# Patient Record
Sex: Male | Born: 1980 | Race: White | Hispanic: No | Marital: Married | Smoking: Never smoker
Health system: Southern US, Community
[De-identification: ages and names within clinical notes are randomized; demographics above are authoritative.]

---

## 2014-06-05 ENCOUNTER — Encounter: Payer: Self-pay | Admitting: Family Medicine

## 2014-06-05 ENCOUNTER — Ambulatory Visit (INDEPENDENT_AMBULATORY_CARE_PROVIDER_SITE_OTHER): Payer: BLUE CROSS/BLUE SHIELD | Admitting: Family Medicine

## 2014-06-05 VITALS — BP 108/69 | HR 56 | Ht 74.0 in | Wt 253.0 lb

## 2014-06-05 DIAGNOSIS — Z0000 Encounter for general adult medical examination without abnormal findings: Secondary | ICD-10-CM | POA: Diagnosis not present

## 2014-06-05 DIAGNOSIS — E785 Hyperlipidemia, unspecified: Secondary | ICD-10-CM | POA: Insufficient documentation

## 2014-06-05 DIAGNOSIS — R740 Nonspecific elevation of levels of transaminase and lactic acid dehydrogenase [LDH]: Secondary | ICD-10-CM | POA: Diagnosis not present

## 2014-06-05 DIAGNOSIS — R7401 Elevation of levels of liver transaminase levels: Secondary | ICD-10-CM

## 2014-06-05 NOTE — Progress Notes (Signed)
Findings name CC: Donald Baker is a 34 y.o. male is here for Establish Care   Subjective: HPI:  Colonoscopy: No family history of colon cancer, will consider screening at age 34. Prostate: Discussed screening risks/beneifts with patient today, will begin screening at age 34.  Influenza Vaccine: Not indicated Pneumovax: Not indicated Td/Tdap: Up-to-date as of 5 years ago Zoster: (Start 32 yo)  Presents for complete physical exam.   His only complaint is that his ALT was elevated to 62 when checked 3 weeks ago. He works out regularly has lost 30 pounds since January does not drink any alcohol and does not use any acetaminophen products. He tells me in the past he was told he had a fatty liver but never had any imaging of his liver. Triglycerides were normal LDL was mildly elevated. He denies any recent or remote right upper quadrant pain nausea or abdominal pain.  Review of Systems - General ROS: negative for - chills, fever, night sweats, weight gain or weight loss Ophthalmic ROS: negative for - decreased vision Psychological ROS: negative for - anxiety or depression ENT ROS: negative for - hearing change, nasal congestion, tinnitus or allergies Hematological and Lymphatic ROS: negative for - bleeding problems, bruising or swollen lymph nodes Breast ROS: negative Respiratory ROS: no cough, shortness of breath, or wheezing Cardiovascular ROS: no chest pain or dyspnea on exertion Gastrointestinal ROS: no abdominal pain, change in bowel habits, or black or bloody stools Genito-Urinary ROS: negative for - genital discharge, genital ulcers, incontinence or abnormal bleeding from genitals Musculoskeletal ROS: negative for - joint pain or muscle pain Neurological ROS: negative for - headaches or memory loss Dermatological ROS: negative for lumps, mole changes, rash and skin lesion changes  History reviewed. No pertinent past medical history.  History reviewed. No pertinent past surgical  history. Family History  Problem Relation Age of Onset  . Leukemia      History   Social History  . Marital Status: Married    Spouse Name: N/A  . Number of Children: N/A  . Years of Education: N/A   Occupational History  . Not on file.   Social History Main Topics  . Smoking status: Never Smoker   . Smokeless tobacco: Not on file  . Alcohol Use: No  . Drug Use: No  . Sexual Activity:    Partners: Female   Other Topics Concern  . Not on file   Social History Narrative  . No narrative on file     Objective: BP 108/69 mmHg  Pulse 56  Ht 6\' 2"  (1.88 m)  Wt 253 lb (114.76 kg)  BMI 32.47 kg/m2  General: No Acute Distress HEENT: Atraumatic, normocephalic, conjunctivae normal without scleral icterus.  No nasal discharge, hearing grossly intact, TMs with good landmarks bilaterally with no middle ear abnormalities, posterior pharynx clear without oral lesions. Neck: Supple, trachea midline, no cervical nor supraclavicular adenopathy. Pulmonary: Clear to auscultation bilaterally without wheezing, rhonchi, nor rales. Cardiac: Regular rate and rhythm.  No murmurs, rubs, nor gallops. No peripheral edema.  2+ peripheral pulses bilaterally. Abdomen: Bowel sounds normal.  No masses.  Non-tender without rebound.  Negative Murphy's sign. MSK: Grossly intact, no signs of weakness.  Full strength throughout upper and lower extremities.  Full ROM in upper and lower extremities.  No midline spinal tenderness. Neuro: Gait unremarkable, CN II-XII grossly intact.  C5-C6 Reflex 2/4 Bilaterally, L4 Reflex 2/4 Bilaterally.  Cerebellar function intact. Skin: No rashes. Psych: Alert and oriented to person/place/time.  Thought  process normal. No anxiety/depression.  Assessment & Plan: Devanta was seen today for establish care.  Diagnoses and all orders for this visit:  Annual physical exam Orders: -     COMPLETE METABOLIC PANEL WITH GFR -     CBC  Elevated ALT measurement  Healthy  lifestyle interventions including but not limited to regular exercise, a healthy low fat diet, moderation of salt intake, the dangers of tobacco/alcohol/recreational drug use, nutrition supplementation, and accident avoidance were discussed with the patient and a handout was provided for future reference.  No need for lipid panel but discussed diet and exercise interventions to help lower LDL. Repeat liver enzymes, I've asked him to avoid doing any strenuous activity for 24 hours prior to this recheck.  Return if symptoms worsen or fail to improve.

## 2014-06-05 NOTE — Patient Instructions (Signed)
Dr. Kire Ferg's General Advice Following Your Complete Physical Exam  The Benefits of Regular Exercise: Unless you suffer from an uncontrolled cardiovascular condition, studies strongly suggest that regular exercise and physical activity will add to both the quality and length of your life.  The World Health Organization recommends 150 minutes of moderate intensity aerobic activity every week.  This is best split over 3-4 days a week, and can be as simple as a brisk walk for just over 35 minutes "most days of the week".  This type of exercise has been shown to lower LDL-Cholesterol, lower average blood sugars, lower blood pressure, lower cardiovascular disease risk, improve memory, and increase one's overall sense of wellbeing.  The addition of anaerobic (or "strength training") exercises offers additional benefits including but not limited to increased metabolism, prevention of osteoporosis, and improved overall cholesterol levels.  How Can I Strive For A Low-Fat Diet?: Current guidelines recommend that 25-35 percent of your daily energy (food) intake should come from fats.  One might ask how can this be achieved without having to dissect each meal on a daily basis?  Switch to skim or 1% milk instead of whole milk.  Focus on lean meats such as ground turkey, fresh fish, baked chicken, and lean cuts of beef as your source of dietary protein.  Limit saturated fat consumption to less than 10% of your daily caloric intake.  Limit trans fatty acid consumption primarily by limiting synthetic trans fats such as partially hydrogenated oils (Ex: fried fast foods).  Substitute olive or vegetable oil for solid fats where possible.  Moderation of Salt Intake: Provided you don't carry a diagnosis of congestive heart failure nor renal failure, I recommend a daily allowance of no more than 2300 mg of salt (sodium).  Keeping under this daily goal is associated with a decreased risk of cardiovascular events, creeping  above it can lead to elevated blood pressures and increases your risk of cardiovascular events.  Milligrams (mg) of salt is listed on all nutrition labels, and your daily intake can add up faster than you think.  Most canned and frozen dinners can pack in over half your daily salt allowance in one meal.    Lifestyle Health Risks: Certain lifestyle choices carry specific health risks.  As you may already know, tobacco use has been associated with increasing one's risk of cardiovascular disease, pulmonary disease, numerous cancers, among many other issues.  What you may not know is that there are medications and nicotine replacement strategies that can more than double your chances of successfully quitting.  I would be thrilled to help manage your quitting strategy if you currently use tobacco products.  When it comes to alcohol use, I've yet to find an "ideal" daily allowance.  Provided an individual does not have a medical condition that is exacerbated by alcohol consumption, general guidelines determine "safe drinking" as no more than two standard drinks for a man or no more than one standard drink for a male per day.  However, much debate still exists on whether any amount of alcohol consumption is technically "safe".  My general advice, keep alcohol consumption to a minimum for general health promotion.  If you or others believe that alcohol, tobacco, or recreational drug use is interfering with your life, I would be happy to provide confidential counseling regarding treatment options.  General "Over The Counter" Nutrition Advice: Postmenopausal women should aim for a daily calcium intake of 1200 mg, however a significant portion of this might already be   provided by diets including milk, yogurt, cheese, and other dairy products.  Vitamin D has been shown to help preserve bone density, prevent fatigue, and has even been shown to help reduce falls in the elderly.  Ensuring a daily intake of 800 Units of  Vitamin D is a good place to start to enjoy the above benefits, we can easily check your Vitamin D level to see if you'd potentially benefit from supplementation beyond 800 Units a day.  Folic Acid intake should be of particular concern to women of childbearing age.  Daily consumption of 400-800 mcg of Folic Acid is recommended to minimize the chance of spinal cord defects in a fetus should pregnancy occur.    For many adults, accidents still remain one of the most common culprits when it comes to cause of death.  Some of the simplest but most effective preventitive habits you can adopt include regular seatbelt use, proper helmet use, securing firearms, and regularly testing your smoke and carbon monoxide detectors.  Mesa Janus B. Darlen Gledhill DO [facility] [address], [address] [place], [place] [address] Phone: [phone]  

## 2014-06-07 LAB — CBC
HEMATOCRIT: 46.3 % (ref 39.0–52.0)
Hemoglobin: 15.4 g/dL (ref 13.0–17.0)
MCH: 30 pg (ref 26.0–34.0)
MCHC: 33.3 g/dL (ref 30.0–36.0)
MCV: 90.1 fL (ref 78.0–100.0)
MPV: 10.6 fL (ref 8.6–12.4)
Platelets: 171 10*3/uL (ref 150–400)
RBC: 5.14 MIL/uL (ref 4.22–5.81)
RDW: 12.9 % (ref 11.5–15.5)
WBC: 4 10*3/uL (ref 4.0–10.5)

## 2014-06-07 LAB — COMPLETE METABOLIC PANEL WITH GFR
ALT: 56 U/L — ABNORMAL HIGH (ref 0–53)
AST: 28 U/L (ref 0–37)
Albumin: 4.6 g/dL (ref 3.5–5.2)
Alkaline Phosphatase: 71 U/L (ref 39–117)
BILIRUBIN TOTAL: 0.9 mg/dL (ref 0.2–1.2)
BUN: 22 mg/dL (ref 6–23)
CO2: 24 meq/L (ref 19–32)
CREATININE: 1.16 mg/dL (ref 0.50–1.35)
Calcium: 9.5 mg/dL (ref 8.4–10.5)
Chloride: 101 mEq/L (ref 96–112)
GFR, EST NON AFRICAN AMERICAN: 83 mL/min
GLUCOSE: 96 mg/dL (ref 70–99)
Potassium: 4.1 mEq/L (ref 3.5–5.3)
Sodium: 138 mEq/L (ref 135–145)
Total Protein: 7 g/dL (ref 6.0–8.3)

## 2014-09-12 ENCOUNTER — Telehealth: Payer: Self-pay | Admitting: Family Medicine

## 2014-09-12 DIAGNOSIS — R748 Abnormal levels of other serum enzymes: Secondary | ICD-10-CM

## 2014-09-12 NOTE — Telephone Encounter (Signed)
Pt notified and labs for [facility] faxed to recheck liver enzymes

## 2014-09-12 NOTE — Telephone Encounter (Signed)
Pt called. He needs lab order to rechk abnormal.  Dr told him to have lab redone in 3 mnths.

## 2014-09-13 LAB — COMPLETE METABOLIC PANEL WITH GFR
ALK PHOS: 85 U/L (ref 40–115)
ALT: 100 U/L — ABNORMAL HIGH (ref 9–46)
AST: 39 U/L (ref 10–40)
Albumin: 4.2 g/dL (ref 3.6–5.1)
BUN: 20 mg/dL (ref 7–25)
CALCIUM: 9.1 mg/dL (ref 8.6–10.3)
CHLORIDE: 104 mmol/L (ref 98–110)
CO2: 25 mmol/L (ref 20–31)
Creat: 1.12 mg/dL (ref 0.60–1.35)
GFR, Est African American: >89 mL/min (ref >=60)
GFR, Est Non African American: 87 mL/min (ref >=60)
Glucose, Bld: 97 mg/dL (ref 65–99)
POTASSIUM: 4.2 mmol/L (ref 3.5–5.3)
SODIUM: 141 mmol/L (ref 135–146)
Total Bilirubin: 1 mg/dL (ref 0.2–1.2)
Total Protein: 6.6 g/dL (ref 6.1–8.1)

## 2014-09-14 ENCOUNTER — Telehealth: Payer: Self-pay | Admitting: Family Medicine

## 2014-09-14 MED ORDER — VITAMIN E 180 MG (400 UNIT) PO CAPS: 400.0000 [IU] | ORAL_CAPSULE | Freq: Two times a day (BID) | ORAL | Status: DC

## 2014-09-14 NOTE — Telephone Encounter (Signed)
Left message on vm with results and advice

## 2014-09-14 NOTE — Telephone Encounter (Signed)
Sue Lush, Will you please let patient know that his ALT liver enzyme remains elevated.  I'd recommend he start a 400 Unit Vitamin E supplement twice a day and return to see me in one month.

## 2014-10-13 ENCOUNTER — Encounter: Payer: Self-pay | Admitting: Family Medicine

## 2014-10-13 ENCOUNTER — Ambulatory Visit (INDEPENDENT_AMBULATORY_CARE_PROVIDER_SITE_OTHER): Payer: BLUE CROSS/BLUE SHIELD | Admitting: Family Medicine

## 2014-10-13 VITALS — BP 116/74 | HR 65 | Wt 236.0 lb

## 2014-10-13 DIAGNOSIS — R7989 Other specified abnormal findings of blood chemistry: Secondary | ICD-10-CM | POA: Diagnosis not present

## 2014-10-13 DIAGNOSIS — K5900 Constipation, unspecified: Secondary | ICD-10-CM | POA: Insufficient documentation

## 2014-10-13 DIAGNOSIS — Z23 Encounter for immunization: Secondary | ICD-10-CM | POA: Diagnosis not present

## 2014-10-13 DIAGNOSIS — R945 Abnormal results of liver function studies: Secondary | ICD-10-CM

## 2014-10-13 LAB — HEPATIC FUNCTION PANEL
ALT: 45 U/L (ref 9–46)
AST: 21 U/L (ref 10–40)
Albumin: 4.2 g/dL (ref 3.6–5.1)
Alkaline Phosphatase: 66 U/L (ref 40–115)
BILIRUBIN TOTAL: 1.1 mg/dL (ref 0.2–1.2)
Bilirubin, Direct: 0.2 mg/dL (ref <=0.2)
Indirect Bilirubin: 0.9 mg/dL (ref 0.2–1.2)
TOTAL PROTEIN: 6.4 g/dL (ref 6.1–8.1)

## 2014-10-13 NOTE — Progress Notes (Signed)
CC: Donald Baker is a 34 y.o. male is here for bowel issues   Subjective: HPI:  Follow-up elevated LFTs: He's been taking 400 units of vitamin D twice a day for the last month. He's noticed some right upper quadrant pain that developed over the last month described as a stabbing sensation that will occur only for seconds at a time that is mild in severity and nonradiating. It occurs a couple times a week. It is not reproducible. He's never had this before. He had his wife deny any yellowing of the skin or conjunctiva. He denies any nausea, vomiting or decrease in appetite. He's lost weight since I saw him last intentionally by reducing calories.  Complains of pain with defecation and often requiring 20 minutes on the toilet to feel like he is evacuated his bowels. This complaint of pain is localized to the rectum. He states that the caliber of his stool is about the size of the finger. He tells me he only has a bowel movement 3 times a week. He denies any pain other than that described above. No interventions as of yet. Symptoms at present on a monthly basis for the past year but he's never had this before. Denies any melanoma or blood in his stool.    Review Of Systems Outlined In HPI  No past medical history on file.  No past surgical history on file. Family History  Problem Relation Age of Onset  . Leukemia      Social History   Social History  . Marital Status: Married    Spouse Name: N/A  . Number of Children: N/A  . Years of Education: N/A   Occupational History  . Not on file.   Social History Main Topics  . Smoking status: Never Smoker   . Smokeless tobacco: Not on file  . Alcohol Use: No  . Drug Use: No  . Sexual Activity:    Partners: Female   Other Topics Concern  . Not on file   Social History Narrative     Objective: BP 116/74 mmHg  Pulse 65  Wt 236 lb (107.049 kg)  General: Alert and Oriented, No Acute Distress HEENT: Pupils equal, round, reactive to  light. Conjunctivae clear.Moist nevus membranes  Lungs:Clear and comfortable work of breathing  Cardiac: Regular rate and rhythm. Normal S1/S2.  No murmurs, rubs, nor gallops.   Abdomen: Normal bowel sounds, soft and non tender without palpable masses. No guarding rigidity or rebound tenderness  Extremities: No peripheral edema.  Strong peripheral pulses.  Mental Status: No depression, anxiety, nor agitation. Skin: Warm and dry.  Assessment & Plan: Hailey was seen today for bowel issues.  Diagnoses and all orders for this visit:  Constipation, unspecified constipation type -     Cancel: US Abdomen Complete; Future  Elevated LFTs -     Cancel: US Abdomen Complete; Future -     Hepatic function panel -     US Abdomen Complete  Other orders -     Flu Vaccine QUAD 36+ mos IM   Constipation: Given decrease in caliber he was provided with stool cards today to screen for any pathology that would be bleeding in the colon such as a mass or inflammatory bowel disease Given the right upper quadrant pain obtaining an ultrasound of the abdomen to rule out cholelithiasis or hepatitis. Repeating liver function tests today as previously planned.  Ultimate plan will be based on the above results   Return if symptoms worsen or  fail to improve.

## 2014-10-17 ENCOUNTER — Telehealth: Payer: Self-pay | Admitting: Family Medicine

## 2014-10-17 ENCOUNTER — Ambulatory Visit (INDEPENDENT_AMBULATORY_CARE_PROVIDER_SITE_OTHER): Payer: BLUE CROSS/BLUE SHIELD

## 2014-10-17 DIAGNOSIS — R1011 Right upper quadrant pain: Secondary | ICD-10-CM | POA: Diagnosis not present

## 2014-10-17 MED ORDER — LINACLOTIDE 145 MCG PO CAPS: 145.0000 ug | ORAL_CAPSULE | Freq: Every day | ORAL | Status: DC

## 2014-10-17 NOTE — Telephone Encounter (Signed)
Pt called regarding [place] results, will route to PCP for review.

## 2014-10-17 NOTE — Telephone Encounter (Signed)
Sue Lush, Will you please let patient know that his liver function tests are all within normal limits.  I'd recommend continuing on Vitamin E and starting a constipation medication called Linzess.  A Rx has been sent to [facility].  Please f/u in one month.

## 2014-10-17 NOTE — Telephone Encounter (Signed)
Pt.notified

## 2014-10-18 LAB — POC HEMOCCULT BLD/STL (HOME/3-CARD/SCREEN)
Card #2 Fecal Occult Blod, POC: NEGATIVE
FECAL OCCULT BLD: NEGATIVE
FECAL OCCULT BLD: NEGATIVE

## 2014-10-18 NOTE — Telephone Encounter (Signed)
Sue Lush, Will you please let patient know that his ultrasound results were not sent to me until after we closed yesterday.  Fortunately there was no abnormality seen on his study.  This is reassuring and I'd still recommend starting the Linzess Rxed yesterday.

## 2014-10-18 NOTE — Telephone Encounter (Signed)
Pt states he does not have rx insurance. He wanted to know what else could be reccomended otc. I advised he could use miralax

## 2014-10-18 NOTE — Addendum Note (Signed)
Addended by: Wyline Beady on: 10/18/2014 04:51 PM   Modules accepted: Orders

## 2018-05-10 ENCOUNTER — Other Ambulatory Visit: Payer: Self-pay

## 2018-05-10 ENCOUNTER — Ambulatory Visit (INDEPENDENT_AMBULATORY_CARE_PROVIDER_SITE_OTHER): Payer: 59 | Admitting: Sports Medicine

## 2018-05-10 ENCOUNTER — Ambulatory Visit (INDEPENDENT_AMBULATORY_CARE_PROVIDER_SITE_OTHER): Payer: 59

## 2018-05-10 DIAGNOSIS — M4726 Other spondylosis with radiculopathy, lumbar region: Secondary | ICD-10-CM

## 2018-05-10 DIAGNOSIS — M5416 Radiculopathy, lumbar region: Secondary | ICD-10-CM

## 2018-05-10 MED ORDER — GABAPENTIN 300 MG PO CAPS: ORAL_CAPSULE | ORAL | 3 refills | Status: DC

## 2018-05-10 NOTE — Assessment & Plan Note (Addendum)
Right L5 radiculitis, failed 2 months now with conservative measures including chiropractic manipulation, steroids. X-rays, MRI ASAP. More aggressive formal PT. Adding gabapentin. Return to see me in 6 weeks, I will set him up for an epidural if no better.

## 2018-05-10 NOTE — Progress Notes (Signed)
Subjective:    CC: Back pain  HPI:  For a couple of months this pleasant 38 year old male has had pain in his low back with radiation down to the buttock, down to the bottom of the right foot.  Worse with standing up straight, pain is worsening, persistent.  No bowel or bladder dysfunction, saddle numbness, constitutional symptoms.  He has had 2 months of conservative measures including chiropractic manipulation, he has had a burst of prednisone, nothing seems to really be helping.  I reviewed the past medical history, family history, social history, surgical history, and allergies today and no changes were needed.  Please see the problem list section below in epic for further details.  Past Medical History: No past medical history on file. Past Surgical History: No past surgical history on file. Social History: Social History   Socioeconomic History  . Marital status: Married    Spouse name: Not on file  . Number of children: Not on file  . Years of education: Not on file  . Highest education level: Not on file  Occupational History  . Not on file  Social Needs  . Financial resource strain: Not on file  . Food insecurity:    Worry: Not on file    Inability: Not on file  . Transportation needs:    Medical: Not on file    Non-medical: Not on file  Tobacco Use  . Smoking status: Never Smoker  Substance and Sexual Activity  . Alcohol use: No    Alcohol/week: 0.0 standard drinks  . Drug use: No  . Sexual activity: Yes    Partners: Female  Lifestyle  . Physical activity:    Days per week: Not on file    Minutes per session: Not on file  . Stress: Not on file  Relationships  . Social connections:    Talks on phone: Not on file    Gets together: Not on file    Attends religious service: Not on file    Active member of club or organization: Not on file    Attends meetings of clubs or organizations: Not on file    Relationship status: Not on file  Other Topics Concern   . Not on file  Social History Narrative  . Not on file   Family History: Family History  Problem Relation Age of Onset  . Leukemia Unknown    Allergies: No Known Allergies Medications: See med rec.  Review of Systems: No headache, visual changes, nausea, vomiting, diarrhea, constipation, dizziness, abdominal pain, skin rash, fevers, chills, night sweats, swollen lymph nodes, weight loss, chest pain, body aches, joint swelling, muscle aches, shortness of breath, mood changes, visual or auditory hallucinations.  Objective:    General: Well Developed, well nourished, and in no acute distress.  Neuro: Alert and oriented x3, extra-ocular muscles intact, sensation grossly intact.  HEENT: Normocephalic, atraumatic, pupils equal round reactive to light, neck supple, no masses, no lymphadenopathy, thyroid nonpalpable.  Skin: Warm and dry, no rashes noted.  Cardiac: Regular rate and rhythm, no murmurs rubs or gallops.  Respiratory: Clear to auscultation bilaterally. Not using accessory muscles, speaking in full sentences.  Abdominal: Soft, nontender, nondistended, positive bowel sounds, no masses, no organomegaly.  Back Exam:  Inspection: Unremarkable  Motion: Flexion 45 deg, Extension 45 deg, Side Bending to 45 deg bilaterally,  Rotation to 45 deg bilaterally  SLR laying: Positive on the right XSLR laying: Negative  Palpable tenderness: None. FABER: negative. Sensory change: Gross sensation intact to  all lumbar and sacral dermatomes.  Reflexes: 2+ at both patellar tendons, 2+ at achilles tendons, Babinski's downgoing.  Strength at foot  Plantar-flexion: 5/5 Dorsi-flexion: 5/5 Eversion: 5/5 Inversion: 5/5  Leg strength  Quad: 5/5 Hamstring: 5/5 Hip flexor: 5/5 Hip abductors: 5/5  Gait unremarkable.  Impression and Recommendations:    The patient was counselled, risk factors were discussed, anticipatory guidance given.  Right lumbar radiculitis Right L5 radiculitis, failed 2 months  now with conservative measures including chiropractic manipulation, steroids. X-rays, MRI ASAP. More aggressive formal PT. Adding gabapentin. Return to see me in 6 weeks, I will set him up for an epidural if no better.   ___________________________________________ Ihor Austin. Benjamin Stain, M.D., ABFM., CAQSM. Primary Care and Sports Medicine [facility]  Adjunct Professor of Family Medicine  [employer]

## 2018-05-16 ENCOUNTER — Other Ambulatory Visit: Payer: Self-pay

## 2018-05-16 ENCOUNTER — Ambulatory Visit (INDEPENDENT_AMBULATORY_CARE_PROVIDER_SITE_OTHER): Payer: 59

## 2018-05-16 DIAGNOSIS — M5116 Intervertebral disc disorders with radiculopathy, lumbar region: Secondary | ICD-10-CM

## 2018-05-16 DIAGNOSIS — M5416 Radiculopathy, lumbar region: Secondary | ICD-10-CM

## 2018-05-28 ENCOUNTER — Telehealth: Payer: Self-pay | Admitting: *Deleted

## 2018-05-28 DIAGNOSIS — M5416 Radiculopathy, lumbar region: Secondary | ICD-10-CM

## 2018-05-28 MED ORDER — MELOXICAM 15 MG PO TABS: ORAL_TABLET | ORAL | 3 refills | Status: AC

## 2018-05-28 NOTE — Telephone Encounter (Signed)
Switching to meloxicam, no ibuprofen when he takes meloxicam. He can also do an arthritis strength Tylenol 3 times daily.

## 2018-05-28 NOTE — Telephone Encounter (Signed)
Pt notified of medication instructions. 

## 2018-05-28 NOTE — Telephone Encounter (Signed)
Pt called today wanting to know if Meloxicam would be more beneficial to him than the 800mg  of ibuprofen that he's taking around every 6hrs every day.  He said the last couple weeks of PT have been pretty rough and that he's had quite a bit of pain but they did dry needling today and he actually feels a little bit better now.  Please advise on the anti-inflammatories.

## 2018-06-01 ENCOUNTER — Other Ambulatory Visit: Payer: Self-pay | Admitting: Sports Medicine

## 2018-06-01 DIAGNOSIS — M5416 Radiculopathy, lumbar region: Secondary | ICD-10-CM

## 2018-06-02 ENCOUNTER — Other Ambulatory Visit: Payer: Self-pay | Admitting: Sports Medicine

## 2018-06-02 DIAGNOSIS — M5416 Radiculopathy, lumbar region: Secondary | ICD-10-CM

## 2018-06-02 MED ORDER — GABAPENTIN 300 MG PO CAPS: ORAL_CAPSULE | ORAL | 2 refills | Status: DC

## 2018-06-18 ENCOUNTER — Ambulatory Visit (INDEPENDENT_AMBULATORY_CARE_PROVIDER_SITE_OTHER): Payer: 59 | Admitting: Sports Medicine

## 2018-06-18 ENCOUNTER — Encounter: Payer: Self-pay | Admitting: Sports Medicine

## 2018-06-18 DIAGNOSIS — M5416 Radiculopathy, lumbar region: Secondary | ICD-10-CM

## 2018-06-18 MED ORDER — PREDNISONE 10 MG (48) PO TBPK: ORAL_TABLET | Freq: Every day | ORAL | 0 refills | Status: DC

## 2018-06-18 NOTE — Assessment & Plan Note (Signed)
Right L5 radiculitis, improved with physical therapy, gabapentin at 1500 mg in the evening, avoiding daytime dosing due to drowsiness. MRI shows an L4-L5 disc protrusion affecting the right L5 nerve root. I agree that we can continue physical therapy and gabapentin as well as a prolonged prednisone taper for at least another month before considering intervention. If insufficient improvement by the next visit we will proceed with a right L5-S1 transforaminal epidural.

## 2018-06-18 NOTE — Progress Notes (Signed)
Subjective:    CC: Follow-up  HPI: This is a pleasant 38 year old male, we have been treating him for a right L5 radiculitis, he has improved with physical therapy, steroids, gabapentin at 1500 mg at night, daytime dosing causes excessive grogginess.  Happy with how things are going and desires to do additional conservative treatment.  I reviewed the past medical history, family history, social history, surgical history, and allergies today and no changes were needed.  Please see the problem list section below in epic for further details.  Past Medical History: No past medical history on file. Past Surgical History: No past surgical history on file. Social History: Social History   Socioeconomic History  . Marital status: Married    Spouse name: Not on file  . Number of children: Not on file  . Years of education: Not on file  . Highest education level: Not on file  Occupational History  . Not on file  Social Needs  . Financial resource strain: Not on file  . Food insecurity:    Worry: Not on file    Inability: Not on file  . Transportation needs:    Medical: Not on file    Non-medical: Not on file  Tobacco Use  . Smoking status: Never Smoker  . Smokeless tobacco: Never Used  Substance and Sexual Activity  . Alcohol use: No    Alcohol/week: 0.0 standard drinks  . Drug use: No  . Sexual activity: Yes    Partners: Female  Lifestyle  . Physical activity:    Days per week: Not on file    Minutes per session: Not on file  . Stress: Not on file  Relationships  . Social connections:    Talks on phone: Not on file    Gets together: Not on file    Attends religious service: Not on file    Active member of club or organization: Not on file    Attends meetings of clubs or organizations: Not on file    Relationship status: Not on file  Other Topics Concern  . Not on file  Social History Narrative  . Not on file   Family History: Family History  Problem Relation Age  of Onset  . Leukemia Unknown    Allergies: No Known Allergies Medications: See med rec.  Review of Systems: No fevers, chills, night sweats, weight loss, chest pain, or shortness of breath.   Objective:    General: Well Developed, well nourished, and in no acute distress.  Neuro: Alert and oriented x3, extra-ocular muscles intact, sensation grossly intact.  HEENT: Normocephalic, atraumatic, pupils equal round reactive to light, neck supple, no masses, no lymphadenopathy, thyroid nonpalpable.  Skin: Warm and dry, no rashes. Cardiac: Regular rate and rhythm, no murmurs rubs or gallops, no lower extremity edema.  Respiratory: Clear to auscultation bilaterally. Not using accessory muscles, speaking in full sentences.  Impression and Recommendations:    Right lumbar radiculitis Right L5 radiculitis, improved with physical therapy, gabapentin at 1500 mg in the evening, avoiding daytime dosing due to drowsiness. MRI shows an L4-L5 disc protrusion affecting the right L5 nerve root. I agree that we can continue physical therapy and gabapentin as well as a prolonged prednisone taper for at least another month before considering intervention. If insufficient improvement by the next visit we will proceed with a right L5-S1 transforaminal epidural.   ___________________________________________ Ihor Austin. Benjamin Stain, M.D., ABFM., CAQSM. Primary Care and Sports Medicine [facility]  Adjunct Professor of [facility] [employer] [facility]  Medicine  [employer]

## 2018-06-22 ENCOUNTER — Ambulatory Visit: Payer: 59 | Admitting: Sports Medicine

## 2018-07-16 ENCOUNTER — Ambulatory Visit: Payer: 59 | Admitting: Sports Medicine

## 2018-09-06 ENCOUNTER — Telehealth: Payer: Self-pay | Admitting: *Deleted

## 2018-09-06 DIAGNOSIS — M5416 Radiculopathy, lumbar region: Secondary | ICD-10-CM

## 2018-09-06 NOTE — Telephone Encounter (Signed)
Pt left vm stating that he is ready to proceed with an epidural.

## 2018-09-06 NOTE — Telephone Encounter (Signed)
Epidural ordered, please contact [facility] for scheduling. 

## 2018-09-07 NOTE — Telephone Encounter (Signed)
Order given to [facility] and pt was notified of the order as well.

## 2018-09-15 ENCOUNTER — Ambulatory Visit
Admission: RE | Admit: 2018-09-15 | Discharge: 2018-09-15 | Disposition: A | Discharge status: Home / Self Care | Payer: No Typology Code available for payment source | Source: Ambulatory Visit | Attending: Sports Medicine | Admitting: Sports Medicine

## 2018-09-15 ENCOUNTER — Other Ambulatory Visit: Payer: Self-pay

## 2018-09-15 MED ORDER — METHYLPREDNISOLONE ACETATE 40 MG/ML INJ SUSP (RADIOLOG
120.0000 mg | Freq: Once | INTRAMUSCULAR | Status: AC
  Administered 2018-09-15: 120 mg via EPIDURAL

## 2018-09-15 MED ORDER — IOPAMIDOL (ISOVUE-M 200) INJECTION 41%
1.0000 mL | Freq: Once | INTRAMUSCULAR | Status: AC
  Administered 2018-09-15: 09:00:00 1 mL via EPIDURAL

## 2018-09-15 NOTE — Discharge Instructions (Signed)

## 2018-10-13 ENCOUNTER — Emergency Department
Admission: EM | Admit: 2018-10-13 | Discharge: 2018-10-13 | Disposition: A | Discharge status: Home / Self Care | Payer: No Typology Code available for payment source | Source: Home / Self Care | Attending: Emergency Medicine | Admitting: Emergency Medicine

## 2018-10-13 ENCOUNTER — Other Ambulatory Visit: Payer: Self-pay

## 2018-10-13 DIAGNOSIS — Z23 Encounter for immunization: Secondary | ICD-10-CM

## 2018-10-13 DIAGNOSIS — S71132A Puncture wound without foreign body, left thigh, initial encounter: Secondary | ICD-10-CM

## 2018-10-13 MED ORDER — AMOXICILLIN-POT CLAVULANATE 875-125 MG PO TABS: 1.0000 | ORAL_TABLET | Freq: Two times a day (BID) | ORAL | 0 refills | Status: AC

## 2018-10-13 MED ORDER — TETANUS-DIPHTH-ACELL PERTUSSIS 5-2.5-18.5 LF-MCG/0.5 IM SUSP
0.5000 mL | Freq: Once | INTRAMUSCULAR | Status: AC
  Administered 2018-10-13: 0.5 mL via INTRAMUSCULAR

## 2018-10-13 NOTE — Discharge Instructions (Signed)
You have a puncture wound left thigh, occurred 7 days ago. No bleeding now.  No sign of foreign body. Today, we cleaned and wrapped the wound.  I would not use Steri-Strips at this point, as we need to allow the wound to heal from underneath. Gently clean the wound with antibacterial soap, and redress with a bandage once a day. You likely have low-grade wound infection.-Have sent an antibiotic, Augmentin to your pharmacy, twice a day for 7 days. Tetanus shot given today. Follow-up here or with PCP if not improved in 1 week, sooner if worse or new symptoms. Please read attached instruction sheet on puncture wound.

## 2018-10-13 NOTE — ED Provider Notes (Signed)
[facility]    CSN: [id number] Arrival date & time: 10/13/18  1230      History   Chief Complaint Chief Complaint  Patient presents with  . Laceration    HPI Donald Baker is a 38 y.o. male.   HPI Chief complaint: Puncture wound left thigh. Occurred 7 days ago when he was at the beach, he was cleaning fish and dropped a fillet knife accidentally, incurring puncture wound left anterior thigh.  Initially bled, with bleeding controlled with direct pressure and he has been cleaning it and dressing daily.  Yesterday it bled a little bit, he applied Steri-Strips, but has not had any bleeding in the past 24 hours.  Denies pain in left thigh.  Denies numbness or weakness or loss of function.  Denies red streaks. No fever or chills or nausea or vomiting.  Last tetanus shot 2011. History reviewed. No pertinent past medical history.  Patient Active Problem List   Diagnosis Date Noted  . Right lumbar radiculitis 05/10/2018  . Hyperlipidemia 06/05/2014    History reviewed. No pertinent surgical history.     Home Medications    Prior to Admission medications   Medication Sig Start Date End Date Taking? Authorizing Provider  amoxicillin-clavulanate (AUGMENTIN) 875-125 MG tablet Take 1 tablet by mouth 2 (two) times daily for 7 days. Take for 10 days.  Take with food 10/13/18 10/20/18  Jacqulyn Cane, MD  CLOMIPHENE CITRATE PO Take by mouth.    [provider]  gabapentin (NEURONTIN) 300 MG capsule 1 capsule p.o. up to 3 times a day 06/02/18   Silverio Decamp, MD  meloxicam (MOBIC) 15 MG tablet One tab PO qAM with breakfast for 2 weeks, then daily prn pain. 05/28/18   Silverio Decamp, MD  testosterone cypionate (DEPOTESTOSTERONE CYPIONATE) 200 MG/ML injection ADMINISTER 0.5 ML (100 MG) INTRAMUSCULARLY EVERY WEEK 05/06/18   [provider]    Family History Family History  Problem Relation Age of Onset  . Leukemia Other     Social History Social  History   Tobacco Use  . Smoking status: Never Smoker  . Smokeless tobacco: Never Used  Substance Use Topics  . Alcohol use: No    Alcohol/week: 0.0 standard drinks  . Drug use: No     Allergies   Patient has no known allergies.   Review of Systems Review of Systems  All other systems reviewed and are negative.    Physical Exam Triage Vital Signs ED Triage Vitals  Enc Vitals Group     BP 10/13/18 1251 (!) 148/91     Pulse Rate 10/13/18 1251 82     Resp 10/13/18 1251 18     Temp 10/13/18 1251 98.7 F (37.1 C)     Temp Source 10/13/18 1251 Oral     SpO2 10/13/18 1251 97 %     Weight 10/13/18 1252 286 lb 9.6 oz (130 kg)     Height 10/13/18 1252 6\' 2"  (1.88 m)     Head Circumference --      Peak Flow --      Pain Score 10/13/18 1251 0     Pain Loc --      Pain Edu? --      Excl. in [place]? --    No data found.  Updated Vital Signs BP (!) 148/91 (BP Location: Right Arm)   Pulse 82   Temp 98.7 F (37.1 C) (Oral)   Resp 18   Ht 6\' 2"  (1.88 m)  Wt 130 kg   SpO2 97%   BMI 36.80 kg/m   Visual Acuity Right Eye Distance:   Left Eye Distance:   Bilateral Distance:    Right Eye Near:   Left Eye Near:    Bilateral Near:     Physical Exam Vitals signs reviewed.  Constitutional:      General: He is not in acute distress.    Appearance: He is well-developed.  HENT:     Head: Normocephalic and atraumatic.  Eyes:     General: No scleral icterus.    Pupils: Pupils are equal, round, and reactive to light.  Neck:     Musculoskeletal: Normal range of motion and neck supple.  Cardiovascular:     Rate and Rhythm: Normal rate and regular rhythm.  Pulmonary:     Effort: Pulmonary effort is normal.  Abdominal:     General: There is no distension.  Musculoskeletal:       Legs:  Skin:    General: Skin is warm and dry.  Neurological:     Mental Status: He is alert and oriented to person, place, and time.     Cranial Nerves: No cranial nerve deficit.   Psychiatric:        Behavior: Behavior normal.      UC Treatments / Results  Labs (all labs ordered are listed, but only abnormal results are displayed) Labs Reviewed - No data to display  EKG   Radiology No results found.  Procedures Procedures (including critical care time)  Medications Ordered in UC Medications  Tdap (BOOSTRIX) injection 0.5 mL (0.5 mLs Intramuscular Given 10/13/18 1315)    Initial Impression / Assessment and Plan / UC Course  I have reviewed the triage vital signs and the nursing notes.  Pertinent labs & imaging results that were available during my care of the patient were reviewed by me and considered in my medical decision making (see chart for details).      Final Clinical Impressions(s) / UC Diagnoses   Final diagnoses:  Puncture wound of left thigh with complication, initial encounter  7 days status post puncture wound. Explained this would need to heal up by secondary intention. I explained that he probably has wound infection without sign of abscess or foreign body.    Discharge Instructions     You have a puncture wound left thigh, occurred 7 days ago. No bleeding now.  No sign of foreign body. Today, we cleaned and wrapped the wound.  I would not use Steri-Strips at this point, as we need to allow the wound to heal from underneath. Gently clean the wound with antibacterial soap, and redress with a bandage once a day. You likely have low-grade wound infection.-Have sent an antibiotic, Augmentin to your pharmacy, twice a day for 7 days. Tetanus shot given today. Follow-up here or with PCP if not improved in 1 week, sooner if worse or new symptoms. Please read attached instruction sheet on puncture wound.     ED Prescriptions    Medication Sig Dispense Auth. Provider   amoxicillin-clavulanate (AUGMENTIN) 875-125 MG tablet Take 1 tablet by mouth 2 (two) times daily for 7 days. Take for 10 days.  Take with food 14 tablet Lajean ManesMassey,  David, MD     He denies any pain, so no pain medication indicated. Red flags discussed. He voiced understanding with above.   Lajean ManesMassey, David, MD 10/13/18 [id number]

## 2018-10-13 NOTE — ED Triage Notes (Signed)
Pt here today for laceration of left thigh that occurred last week while [place] the beach. Dropped a filet knife into thigh. Last tdap Jan 2011. Currently has steri strips on gash. Says it started "to squirt blood last night when he bent down".

## 2018-11-15 ENCOUNTER — Other Ambulatory Visit: Payer: Self-pay | Admitting: Sports Medicine

## 2018-11-15 DIAGNOSIS — M5416 Radiculopathy, lumbar region: Secondary | ICD-10-CM

## 2018-11-15 MED ORDER — GABAPENTIN 300 MG PO CAPS: ORAL_CAPSULE | ORAL | 2 refills | Status: AC

## 2020-01-15 IMAGING — MR MRI LUMBAR SPINE WITHOUT CONTRAST
4 of 5 series · 27 of 48 positions shown · non-contrast
Comparison: None.

CLINICAL DATA: Persistent right leg pain for 2 months

EXAM:
MRI LUMBAR SPINE WITHOUT CONTRAST
TECHNIQUE: Multiplanar, multisequence MR imaging of the lumbar spine was
performed. No intravenous contrast was administered.

[Series 2: T2 · sagittal · 4.0mm · 0.81mm/px · 6 of 15 slices shown (1 of 2)]
[im 1/15]
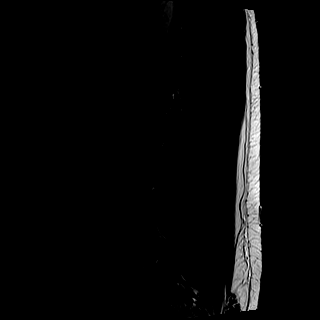
[im 3/15]
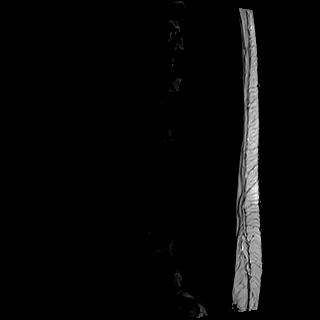
[im 6/15]
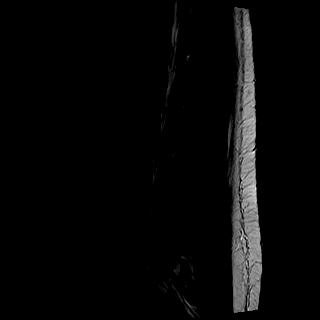
[im 9/15]
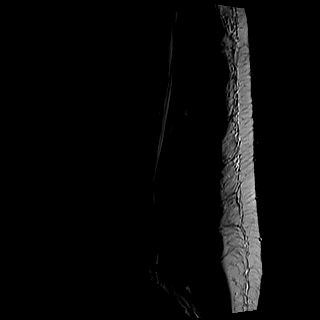
[im 12/15]
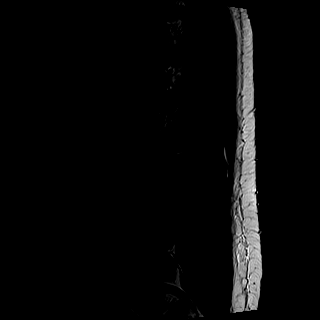
[im 15/15]
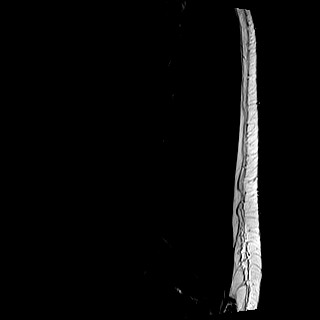

[Series 3: T1 · sagittal · 4.0mm · 0.41mm/px · 5 of 15 slices shown (1 of 2)]
[im 1/15]
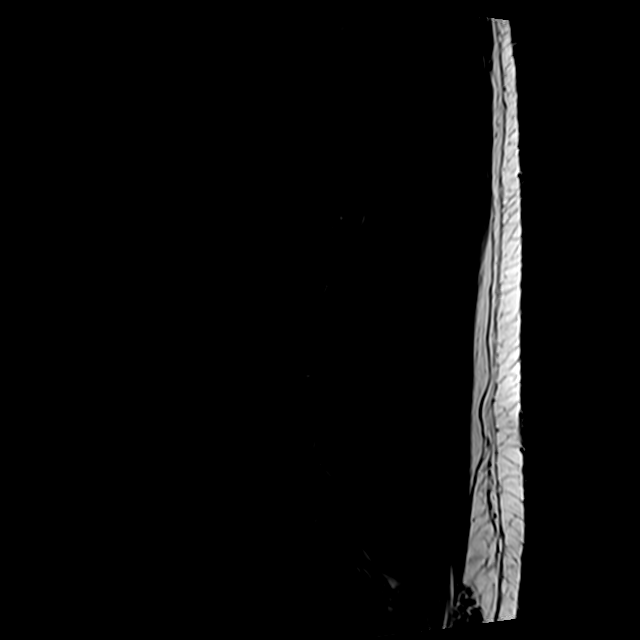
[im 4/15]
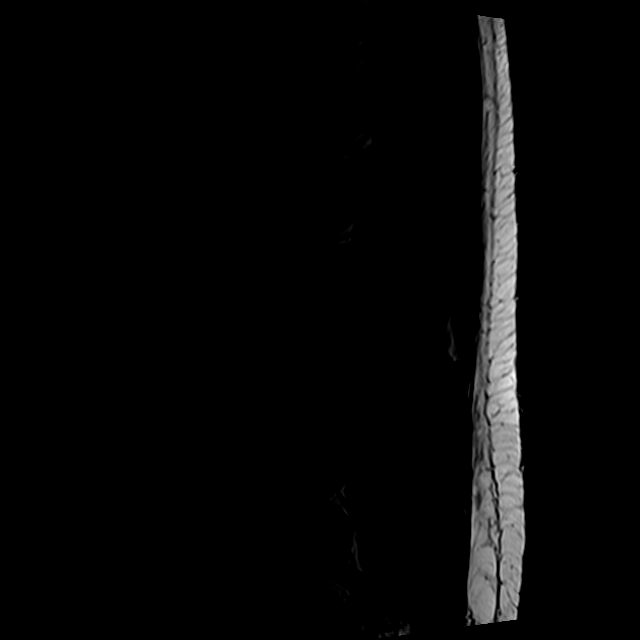
[im 8/15]
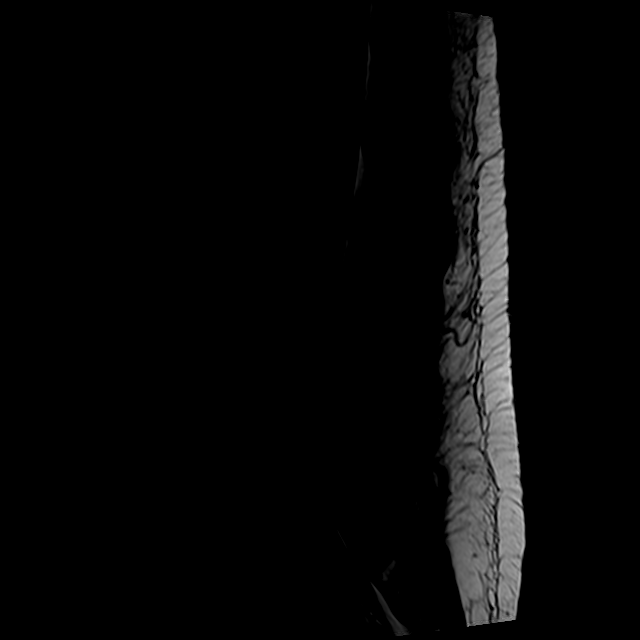
[im 11/15]
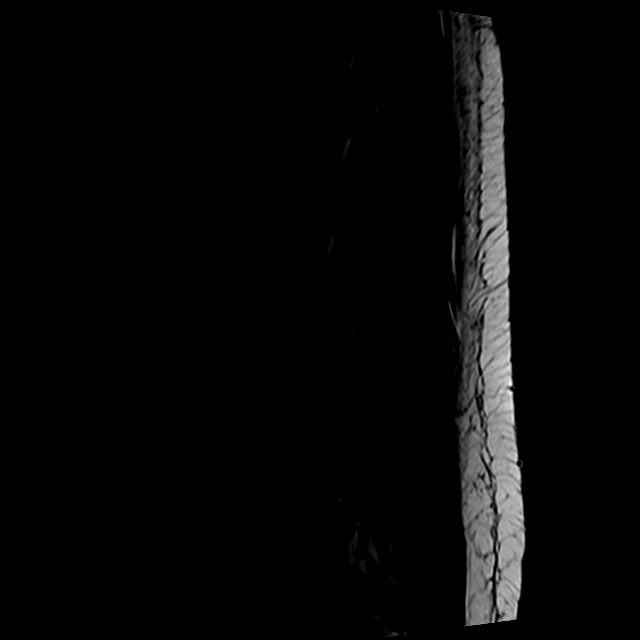
[im 15/15]
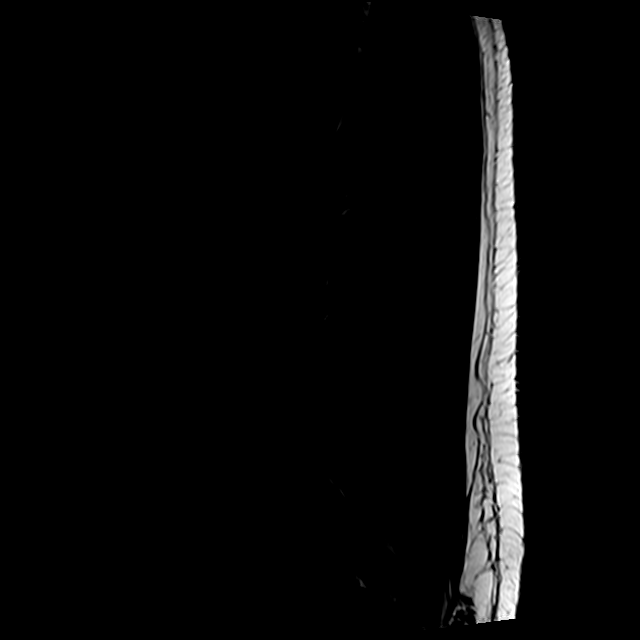

[Series 5: T2 · axial · 4.0mm · 0.78mm/px · z∈[-81,+145]mm · 10 of 47 slices shown (2 of 2)]
[im 4/47]
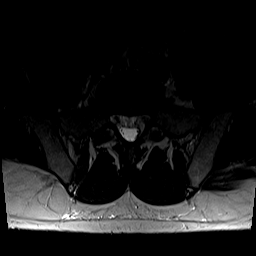
[im 7/47]
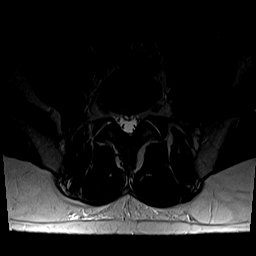
[im 10/47]
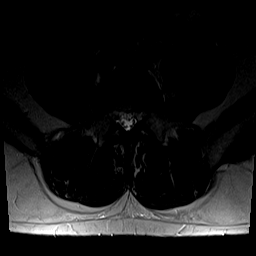
[im 16/47]
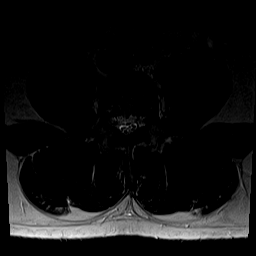
[im 22/47]
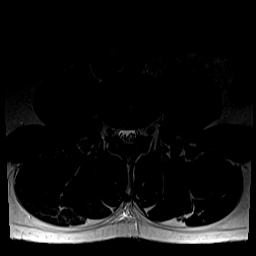
[im 25/47]
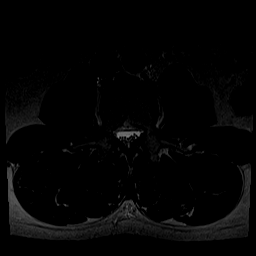
[im 28/47]
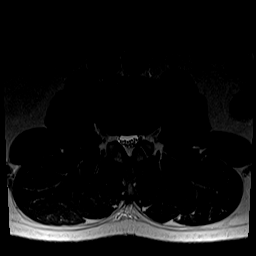
[im 34/47]
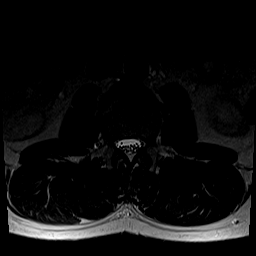
[im 40/47]
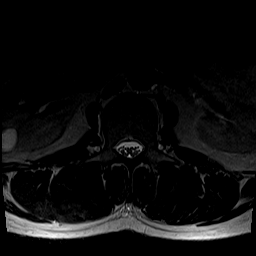
[im 47/47]
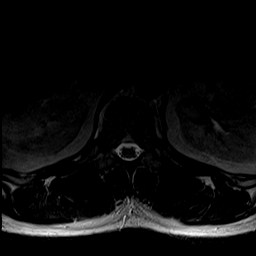

[Series 6: T1 · axial · 4.0mm · 0.39mm/px · z∈[-81,+110]mm · 6 of 47 slices shown (2 of 2)]
[im 4/47]
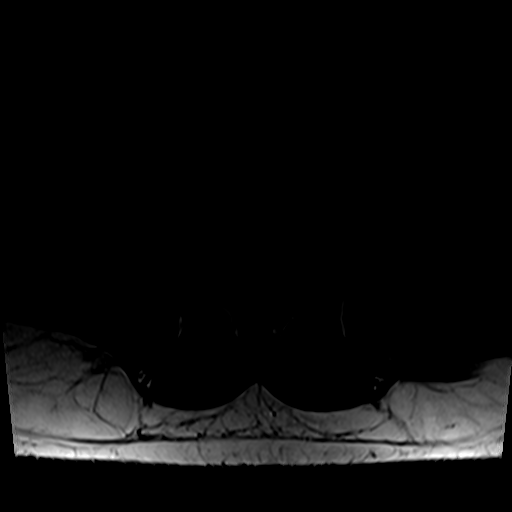
[im 7/47]
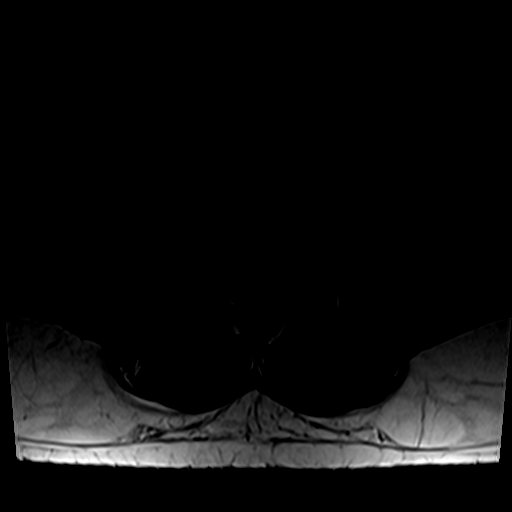
[im 10/47]
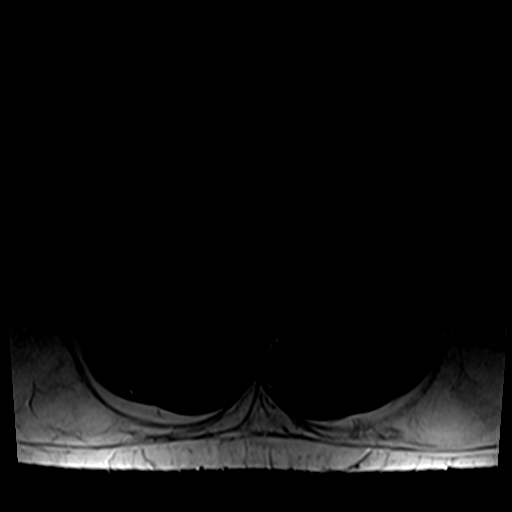
[im 16/47]
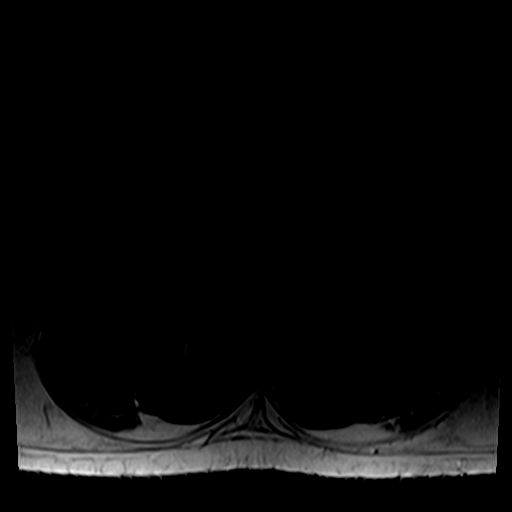
[im 25/47]
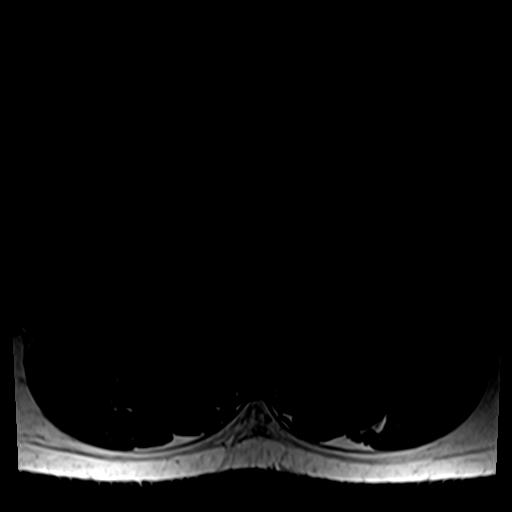
[im 40/47]
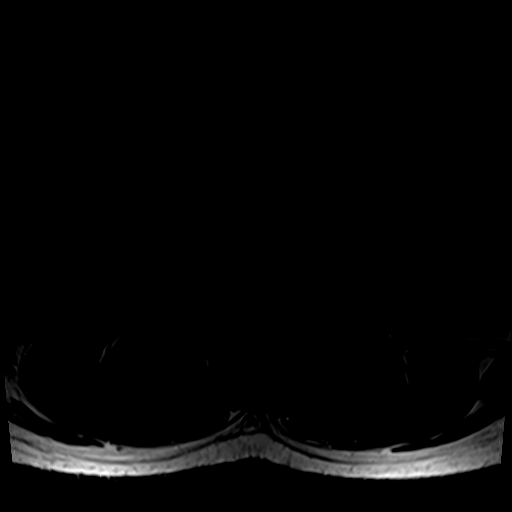

[27 of 48 positions shown; findings below may reference images not displayed]

FINDINGS: Segmentation:  Standard.

Alignment:  Physiologic.

Vertebrae:  No fracture, evidence of discitis, or bone lesion.

Conus medullaris and cauda equina: Conus extends to the T12 level.
Conus and cauda equina appear normal.

Paraspinal and other soft tissues: No acute paraspinal abnormality.

Disc levels:

Disc spaces: Mild disc desiccation at L2-3. Minimal disc desiccation
in the remainder disc spaces. No significant disc height loss.

T12-L1: No significant disc bulge. No evidence of neural foraminal
stenosis. No central canal stenosis.

L1-L2: No significant disc bulge. No evidence of neural foraminal
stenosis. No central canal stenosis.

L2-L3: Minimal broad-based disc bulge. No evidence of neural
foraminal stenosis. No central canal stenosis.

L3-L4: No significant disc bulge. No evidence of neural foraminal
stenosis. No central canal stenosis.

L4-L5: Broad central disc protrusion slightly eccentric towards the
right with mass effect on bilateral intraspinal L5 nerve roots, but
more severe on the right. Mild bilateral facet arthropathy. Moderate
spinal stenosis. No evidence of neural foraminal stenosis.

L5-S1: No significant disc bulge. No evidence of neural foraminal
stenosis. No central canal stenosis.
IMPRESSION: 1. At L4-5 there is a broad central disc protrusion slightly
eccentric towards the right with mass effect on bilateral
intraspinal L5 nerve roots, but more severe on the right. Mild
bilateral facet arthropathy. Moderate spinal stenosis.
